# Patient Record
Sex: Male | Born: 1995 | Race: White | Hispanic: No | Marital: Single | Smoking: Never smoker
Health system: Southern US, Community
[De-identification: ages and names within clinical notes are randomized; demographics above are authoritative.]

## PROBLEM LIST (undated history)

## (undated) DIAGNOSIS — T7840XA Allergy, unspecified, initial encounter: Secondary | ICD-10-CM

## (undated) DIAGNOSIS — L709 Acne, unspecified: Secondary | ICD-10-CM

## (undated) HISTORY — DX: Allergy, unspecified, initial encounter: T78.40XA

## (undated) HISTORY — PX: WISDOM TOOTH EXTRACTION: SHX21

## (undated) HISTORY — DX: Acne, unspecified: L70.9

---
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
## 2015-02-08 ENCOUNTER — Ambulatory Visit (INDEPENDENT_AMBULATORY_CARE_PROVIDER_SITE_OTHER): Payer: Self-pay | Admitting: Family Medicine

## 2015-02-08 VITALS — BP 108/80 | HR 70 | Temp 98.2°F | Resp 16 | Ht 68.0 in | Wt 120.2 lb

## 2015-02-08 DIAGNOSIS — R300 Dysuria: Secondary | ICD-10-CM

## 2015-02-08 LAB — POCT URINALYSIS DIPSTICK
Bilirubin, UA: NEGATIVE
Blood, UA: NEGATIVE
GLUCOSE UA: NEGATIVE
Ketones, UA: NEGATIVE
LEUKOCYTES UA: NEGATIVE
NITRITE UA: NEGATIVE
PROTEIN UA: NEGATIVE
Spec Grav, UA: 1.015
UROBILINOGEN UA: 0.2
pH, UA: 7.5

## 2015-02-08 LAB — POCT UA - MICROSCOPIC ONLY
AMORPHOUS: POSITIVE
BACTERIA, U MICROSCOPIC: NEGATIVE
CRYSTALS, UR, HPF, POC: NEGATIVE
Casts, Ur, LPF, POC: NEGATIVE
EPITHELIAL CELLS, URINE PER MICROSCOPY: NEGATIVE
Mucus, UA: NEGATIVE
RBC, urine, microscopic: NEGATIVE
WBC, UR, HPF, POC: NEGATIVE
YEAST UA: NEGATIVE

## 2015-02-08 NOTE — Patient Instructions (Signed)
I will be in touch with the rest of your labs asap At this time I do not see any sign of a UTI or STI but we will talk once we get the rest of your labs Take care!

## 2015-02-08 NOTE — Progress Notes (Addendum)
[facility] [address], [place] [address] [phone]  Date:  02/08/2015   Name:  Jose Odom   DOB:  1995/08/06   MRN:  [id number]  PCP:  No PCP Per Patient    Chief Complaint: Other   History of Present Illness:  Jose Odom is a 19 y.o. very pleasant male patient who presents with the following:  Here today as a new patient with concern of burning with urination.  He feels a burning sensation in the very tip of his penis- no discomfort in the perineum or prostate area He has noted this about 3 weeks, has waxed and waned.   He has not noted any blood in his urine.   He has drinking a lot of water but his urine still does not seem to be clear No penile discharge or lesions He has never had a UTI in the past as far as he knows-  He has not had sex in about one year so he doubts STI but would like to check  There are no active problems to display for this patient.   Past Medical History  Diagnosis Date  . Allergy     Past Surgical History  Procedure Laterality Date  . Eye surgery      History  Substance Use Topics  . Smoking status: Never Smoker   . Smokeless tobacco: Not on file  . Alcohol Use: 1.8 - 3.0 oz/week    3-5 Standard drinks or equivalent per week    History reviewed. No pertinent family history.  Allergies  Allergen Reactions  . Amoxicillin Rash  . Penicillins Rash    Medication list has been reviewed and updated.  No current outpatient prescriptions on file prior to visit.   No current facility-administered medications on file prior to visit.    Review of Systems:  As per HPI- otherwise negative.   Physical Examination: Filed Vitals:   02/08/15 1348  BP: 108/80  Pulse: 70  Temp: 98.2 F (36.8 C)  Resp: 16   Filed Vitals:   02/08/15 1348  Height:  (1.727 m)  Weight: 120 lb 3.2 oz (54.522 kg)   Body mass index is 18.28 kg/(m^2). Ideal Body Weight: Weight in (lb) to have BMI = 25:  164.1  GEN: WDWN, NAD, Non-toxic, A & O x 3, looks well, slight build, looks well HEENT: Atraumatic, Normocephalic. Neck supple. No masses, No LAD. Ears and Nose: No external deformity. CV: RRR, No M/G/R. No JVD. No thrill. No extra heart sounds. PULM: CTA B, no wheezes, crackles, rhonchi. No retractions. No resp. distress. No accessory muscle use. EXTR: No c/c/e NEURO Normal gait.  PSYCH: Normally interactive. Conversant. Not depressed or anxious appearing.  Calm demeanor.  GU: no discharge or penile lesions, no testicular masses or tenderness   Assessment and Plan: Dysuria - Plan: POCT UA - Microscopic Only, POCT urinalysis dipstick, GC/Chlamydia Probe Amp, Urine culture  Dysuria today but no suggestion of UTI or STI.  He is ok with awaiting the rest of his labs- I will be in touch with him  Signed Abbe Amsterdam, MD pk to [facility] - cell phone  Received the rest of his labs 7/13- called and let him know.  He is feeling somewhat better.  Offered abx for possible parostitis but he prefers to see how he does over the next week. He will give [place] a call if his sx do not completely resolve.    Results for orders  placed or performed in visit on 02/08/15  GC/Chlamydia Probe Amp  Result Value Ref Range   CT Probe RNA NEGATIVE    GC Probe RNA NEGATIVE   Urine culture  Result Value Ref Range   Colony Count NO GROWTH    Organism ID, Bacteria NO GROWTH   POCT UA - Microscopic Only  Result Value Ref Range   WBC, Ur, HPF, POC negative    RBC, urine, microscopic negative    Bacteria, U Microscopic negative    Mucus, UA negative    Epithelial cells, urine per micros negative    Crystals, Ur, HPF, POC negative    Casts, Ur, LPF, POC negative    Yeast, UA negative    Amorphous positive   POCT urinalysis dipstick  Result Value Ref Range   Color, UA yellow    Clarity, UA clear    Glucose, UA negative    Bilirubin, UA negative    Ketones, UA negative    Spec Grav, UA 1.015    Blood, UA  negative    pH, UA 7.5    Protein, UA negative    Urobilinogen, UA 0.2    Nitrite, UA negative    Leukocytes, UA Negative Negative

## 2015-02-09 LAB — URINE CULTURE
Colony Count: NO GROWTH
Organism ID, Bacteria: NO GROWTH

## 2015-02-09 LAB — GC/CHLAMYDIA PROBE AMP
CT PROBE, AMP APTIMA: NEGATIVE
GC PROBE AMP APTIMA: NEGATIVE

## 2015-03-22 ENCOUNTER — Ambulatory Visit (INDEPENDENT_AMBULATORY_CARE_PROVIDER_SITE_OTHER): Payer: Self-pay

## 2015-03-22 ENCOUNTER — Ambulatory Visit (INDEPENDENT_AMBULATORY_CARE_PROVIDER_SITE_OTHER): Payer: Self-pay | Admitting: Family Medicine

## 2015-03-22 VITALS — BP 128/70 | HR 61 | Temp 98.3°F | Resp 18 | Ht 68.0 in | Wt 118.0 lb

## 2015-03-22 DIAGNOSIS — M79645 Pain in left finger(s): Secondary | ICD-10-CM

## 2015-03-22 DIAGNOSIS — M7989 Other specified soft tissue disorders: Secondary | ICD-10-CM

## 2015-03-22 NOTE — Progress Notes (Signed)
[facility] [address] [place] [address] [phone]  Date:  03/22/2015   Name:  Jose Odom   DOB:  May 14, 1996   MRN:  [id number]  PCP:  No PCP Per Patient    Chief Complaint: Hand Pain   History of Present Illness:  This is a 19 y.o. male who is presenting with left pinky finger pain x 1 week. States he noticed a bump on the palmar side of his proximal pinky finger 3 months ago. 1 week ago this bump became painful. He does not recall doing anything 3 months ago prior to bump appearing. He was moving a lot of furniture 1 week ago prior to pain starting. He has pain with touching the buming and with flexing his finger. He feels the finger is a little swollen. He denies other joint pain, fever, chills, paresthesias. No PMH.  Review of Systems:  Review of Systems See HPI  There are no active problems to display for this patient.   Prior to Admission medications   Not on File    Allergies  Allergen Reactions  . Neosporin [Neomycin-Bacitracin Zn-Polymyx] Rash  . Amoxicillin Rash  . Penicillins Rash    Past Surgical History  Procedure Laterality Date  . Eye surgery      Social History  Substance Use Topics  . Smoking status: Never Smoker   . Smokeless tobacco: None  . Alcohol Use: 1.8 - 3.0 oz/week    3-5 Standard drinks or equivalent per week    History reviewed. No pertinent family history.  Medication list has been reviewed and updated.  Physical Examination:  Physical Exam  Constitutional: He is oriented to person, place, and time. He appears well-developed and well-nourished. No distress.  HENT:  Head: Normocephalic and atraumatic.  Right Ear: Hearing normal.  Left Ear: Hearing normal.  Nose: Nose normal.  Eyes: Conjunctivae and lids are normal. Right eye exhibits no discharge. Left eye exhibits no discharge. No scleral icterus.  Cardiovascular: Normal rate, regular rhythm and normal pulses.   Pulmonary/Chest: Effort  normal. No respiratory distress.  Musculoskeletal: Normal range of motion.       Right hand: Normal. He exhibits normal range of motion and no deformity.       Left hand: He exhibits tenderness (ventral surface of left little finger, just distal to MCP. there is a tender bony nodule at site of tenderness), bony tenderness and swelling (mild swelling at proximal little finger). He exhibits normal range of motion and normal capillary refill. Normal sensation noted. Decreased strength noted. He exhibits finger abduction. He exhibits no thumb/finger opposition.  Neurological: He is alert and oriented to person, place, and time.  Skin: Skin is warm, dry and intact. No lesion and no rash noted.  Psychiatric: He has a normal mood and affect. His speech is normal and behavior is normal. Thought content normal.   BP 128/70 mmHg  Pulse 61  Temp(Src) 98.3 F (36.8 C) (Oral)  Resp 18  Ht 5\' 8"  (1.727 m)  Wt 118 lb (53.524 kg)  BMI 17.95 kg/m2  SpO2 99%  [facility] reading (PRIMARY) by  Dr. Patsy Lager: calcification present at base of left 5th digit   Assessment and Plan:  1. Finger pain, left 2. Calcification of soft tissue There is a calcification in the ventral soft tissue of the left 5th finger just distal to MCP. This calcification is palpated on exam and is tender. Referred to hand surgery for further eval/management. Counseled on  RICE in the meantime.  - DG Finger Little Left; Future - Ambulatory referral to Hand Surgery   Roswell Miners. Dyke Brackett, MHS [facility]  03/22/2015

## 2015-03-22 NOTE — Patient Instructions (Signed)
You will get a phone call to make appointment with hand specialist. Ibuprofen/tylenol, rest and ice in the meantime.

## 2015-11-08 ENCOUNTER — Ambulatory Visit (INDEPENDENT_AMBULATORY_CARE_PROVIDER_SITE_OTHER): Payer: Self-pay | Admitting: Physician Assistant

## 2015-11-08 VITALS — BP 118/70 | HR 70 | Temp 97.8°F | Resp 18 | Ht 68.0 in | Wt 121.2 lb

## 2015-11-08 DIAGNOSIS — Z23 Encounter for immunization: Secondary | ICD-10-CM

## 2015-11-08 NOTE — Patient Instructions (Signed)
     IF you received an x-ray today, you will receive an invoice from [facility]. Please contact [facility] at [phone] with questions or concerns regarding your invoice.   IF you received labwork today, you will receive an invoice from [facility]. Please contact [facility] at [phone] with questions or concerns regarding your invoice.   Our billing staff will not be able to assist you with questions regarding bills from these companies.  You will be contacted with the lab results as soon as they are available. The fastest way to get your results is to activate your My Chart account. Instructions are located on the last page of this paperwork. If you have not heard from us regarding the results in 2 weeks, please contact this office.      

## 2015-11-08 NOTE — Progress Notes (Signed)
[facility] [address], [place] [id number] [phone]  Date:  11/08/2015   Name:  Jose Odom   DOB:  04/29/1996   MRN:  [id number]  PCP:  No PCP Per Patient    Chief Complaint: Immunizations   History of Present Illness:  This is a 20 y.o. male who is presenting need tdap for school. Denies fever or chills.  Review of Systems:  Review of Systems See HPI  There are no active problems to display for this patient.   Prior to Admission medications   Not on File    Allergies  Allergen Reactions  . Neosporin [Neomycin-Bacitracin Zn-Polymyx] Rash  . Amoxicillin Rash  . Penicillins Rash    Past Surgical History  Procedure Laterality Date  . Eye surgery      Social History  Substance Use Topics  . Smoking status: Never Smoker   . Smokeless tobacco: None  . Alcohol Use: 1.8 - 3.0 oz/week    3-5 Standard drinks or equivalent per week    History reviewed. No pertinent family history.  Medication list has been reviewed and updated.  Physical Examination:  Physical Exam  Constitutional: He is oriented to person, place, and time. He appears well-developed and well-nourished. No distress.  HENT:  Head: Normocephalic and atraumatic.  Right Ear: Hearing normal.  Left Ear: Hearing normal.  Nose: Nose normal.  Eyes: Conjunctivae and lids are normal. Right eye exhibits no discharge. Left eye exhibits no discharge. No scleral icterus.  Pulmonary/Chest: Effort normal. No respiratory distress.  Musculoskeletal: Normal range of motion.  Neurological: He is alert and oriented to person, place, and time.  Skin: Skin is warm, dry and intact. No lesion and no rash noted.  Psychiatric: He has a normal mood and affect. His speech is normal and behavior is normal. Thought content normal.   BP 118/70 mmHg  Pulse 70  Temp(Src) 97.8 F (36.6 C) (Oral)  Resp 18  Ht 5\' 8"  (1.727 m)  Wt 121 lb 3.2 oz (54.976 kg)  BMI 18.43 kg/m2  SpO2  98%  Assessment and Plan:  1. Need for Tdap vaccination - Tdap vaccine greater than or equal to 7yo IM   Roswell MinersNicole V. Dyke BrackettBush, PA-C, MHS [facility]  11/08/2015

## 2016-02-11 IMAGING — CR DG FINGER LITTLE 2+V*L*
1 series · 1 of 1 positions shown · non-contrast
Comparison: None in PACs

CLINICAL DATA: Palpable knot just distal to the metacarpophalangeal
joint of the fifth finger with tenderness for the past week

EXAM:
LEFT LITTLE FINGER 2+V

[PA]
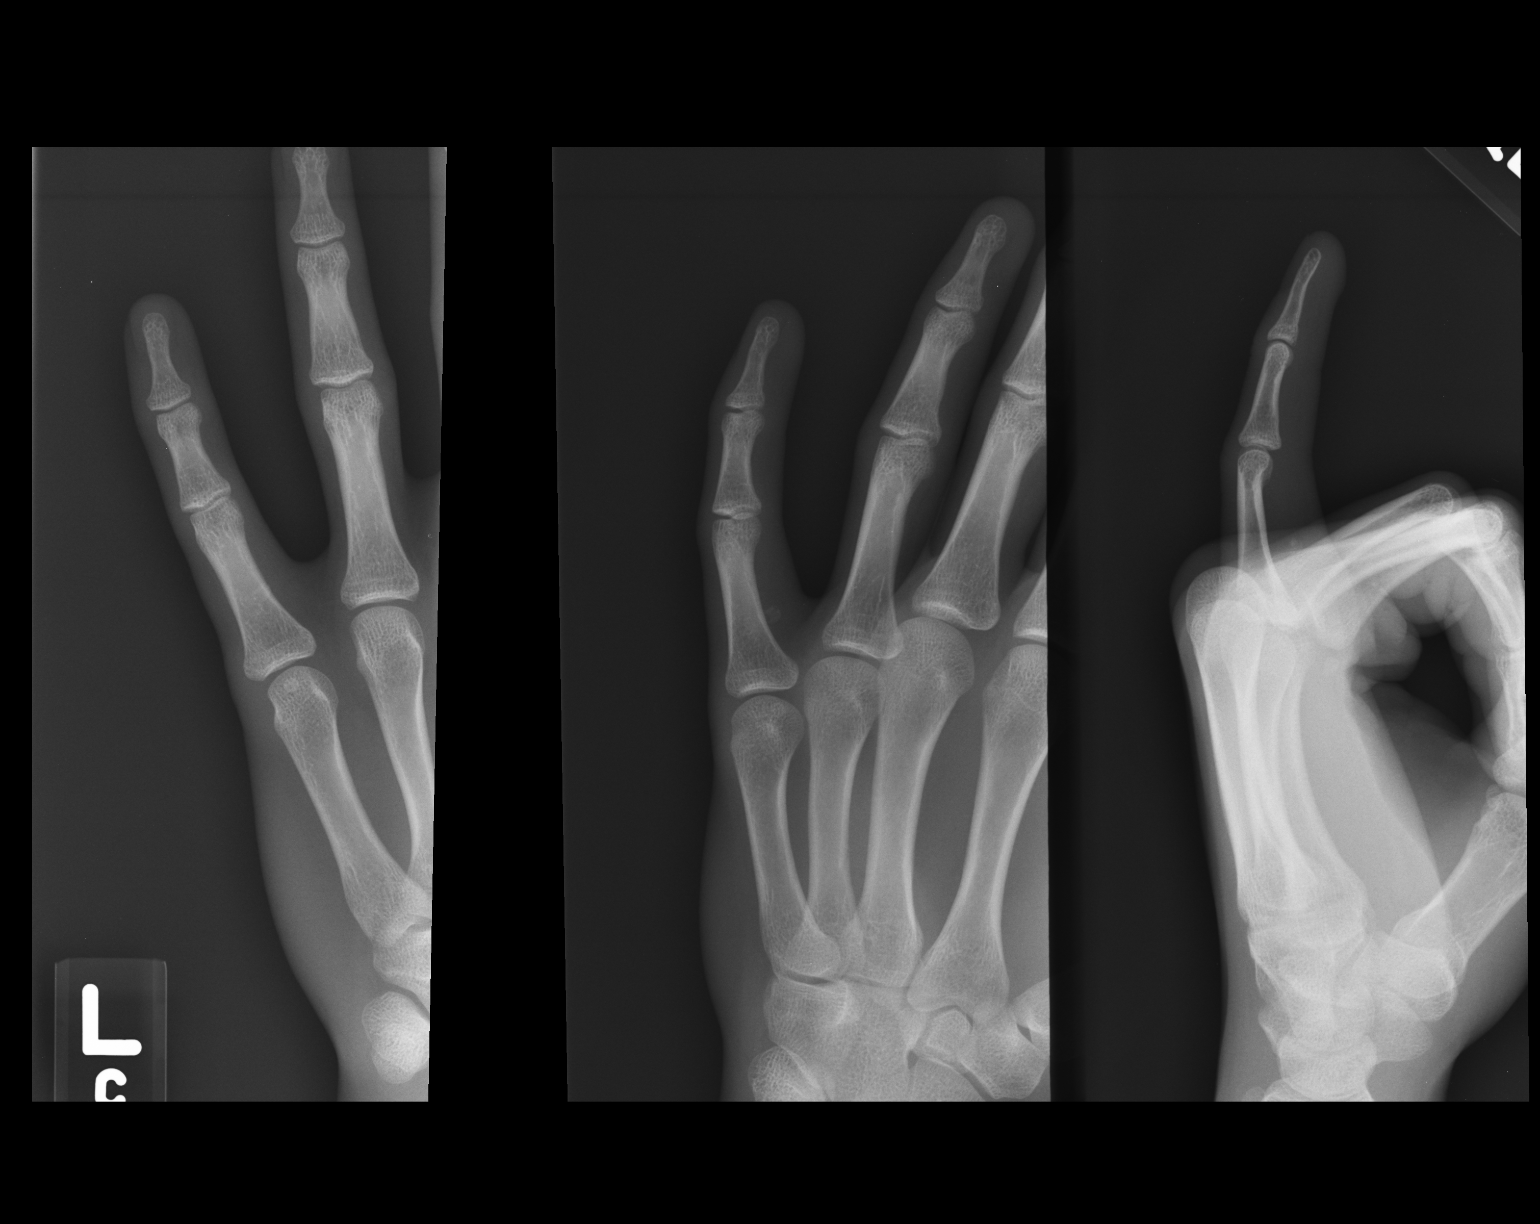

[1 of 1 positions shown; findings below may reference images not displayed]

FINDINGS: The bones are adequately mineralized. The joint spaces are
preserved. Adjacent to the radial aspect of the junction of the
proximal and middle thirds of the proximal phalanx of the fifth
finger there is a 3 mm diameter calcification. This appears to lie
in the ventral soft tissues; it is not clearly attack arising from
the underlying bone.
IMPRESSION: Soft tissue radiodensity likely reflecting calcium within the
ventral soft tissues overlying the proximal phalanx of the fifth
finger without definite detachment the underlying bone. This may
reflect a foreign body if there has been penetrating injury. Soft
tissue calcification in response to previous trauma or infection is
not excluded. Correlation with the radiographic finding with the
site of the palpable finding is needed. Further evaluation with MRI
could be considered if felt clinically indicated. Alternatively
follow-up radiographs considered if the patient's symptoms persist.

## 2016-09-20 ENCOUNTER — Ambulatory Visit: Payer: Self-pay

## 2017-09-19 ENCOUNTER — Encounter: Payer: Self-pay | Admitting: Adult Health

## 2017-09-19 ENCOUNTER — Ambulatory Visit (INDEPENDENT_AMBULATORY_CARE_PROVIDER_SITE_OTHER): Admitting: Adult Health

## 2017-09-19 VITALS — BP 104/64 | Temp 98.2°F | Wt 119.0 lb

## 2017-09-19 DIAGNOSIS — L709 Acne, unspecified: Secondary | ICD-10-CM

## 2017-09-19 DIAGNOSIS — Z7689 Persons encountering health services in other specified circumstances: Secondary | ICD-10-CM | POA: Diagnosis not present

## 2017-09-19 NOTE — Progress Notes (Signed)
Patient presents to clinic today to establish care. He is a pleasant 22 year old male who  has a past medical history of Acne and Allergy.   Acute Concerns: Establish Care   Chronic Issues: Acne - is prescribed doxycycline 100 mg daily   Health Maintenance: Dental --Routine Care  Vision -- Routine Care  Immunizations -- UTD     Past Medical History:  Diagnosis Date  . Acne   . Allergy     Past Surgical History:  Procedure Laterality Date  . EYE SURGERY  1999  . WISDOM TOOTH EXTRACTION      Current Outpatient Medications on File Prior to Visit  Medication Sig Dispense Refill  . doxycycline (VIBRAMYCIN) 100 MG capsule Take 1 capsule by mouth daily. For Acne  0   No current facility-administered medications on file prior to visit.     Allergies  Allergen Reactions  . Neosporin [Neomycin-Bacitracin Zn-Polymyx] Rash  . Amoxicillin Rash  . Penicillins Rash    Family History  Problem Relation Age of Onset  . Alcohol abuse Father   . Heart attack Father   . Heart disease Father   . Hypertension Father   . Melanoma Mother   . Diabetes Mother   . Hearing loss Mother   . Hyperlipidemia Mother   . Diabetes Maternal Grandmother   . Hyperlipidemia Maternal Grandmother   . Stroke Maternal Grandmother   . Diabetes Maternal Grandfather   . Heart attack Maternal Grandfather   . Heart disease Maternal Grandfather   . Hyperlipidemia Maternal Grandfather   . Hypertension Maternal Grandfather   . Cancer Paternal Grandmother   . Heart attack Paternal Grandfather   . Hypertension Paternal Grandfather     Social History   Socioeconomic History  . Marital status: Single    Spouse name: Not on file  . Number of children: Not on file  . Years of education: Not on file  . Highest education level: Not on file  Social Needs  . Financial resource strain: Not on file  . Food insecurity - worry: Not on file  . Food insecurity - inability: Not on file  . Transportation  needs - medical: Not on file  . Transportation needs - non-medical: Not on file  Occupational History  . Not on file  Tobacco Use  . Smoking status: Never Smoker  . Smokeless tobacco: Never Used  Substance and Sexual Activity  . Alcohol use: Yes    Alcohol/week: 1.8 - 3.0 oz    Types: 3 - 5 Standard drinks or equivalent per week  . Drug use: No  . Sexual activity: No  Other Topics Concern  . Not on file  Social History Narrative   He is at [employer] for geology and environmental science    Works part time in lawn care       He enjoys video games and reading    Review of Systems  Constitutional: Negative.   Eyes: Negative.   Respiratory: Negative.   Cardiovascular: Negative.   Gastrointestinal: Negative.   Genitourinary: Negative.   Musculoskeletal: Negative.   Skin: Negative.   Neurological: Negative.   Endo/Heme/Allergies: Negative.   Psychiatric/Behavioral: Negative.   All other systems reviewed and are negative.   BP 104/64 (BP Location: Left Arm)   Temp 98.2 F (36.8 C) (Oral)   Wt 119 lb (54 kg)   BMI 18.09 kg/m   Physical Exam  Constitutional: He is oriented to person, place, and time and well-developed,  well-nourished, and in no distress. No distress.  Cardiovascular: Normal rate, regular rhythm, normal heart sounds and intact distal pulses. Exam reveals no gallop and no friction rub.  No murmur heard. Pulmonary/Chest: Effort normal and breath sounds normal. No respiratory distress. He has no wheezes. He has no rales. He exhibits no tenderness.  Neurological: He is alert and oriented to person, place, and time. He has normal reflexes. He displays normal reflexes. No cranial nerve deficit. He exhibits normal muscle tone. Gait normal. Coordination normal. GCS score is 15.  Skin: Skin is warm and dry. No rash noted. He is not diaphoretic. No erythema. No pallor.  Psychiatric: Mood, memory, affect and judgment normal.  Nursing note and vitals reviewed.      Assessment/Plan: 1. Encounter to establish care - He will follow up in 2 weeks for is CPE  - Follow up sooner if needed  2. Acne, unspecified acne type - Follow up with Dermatology as directed   Shirline Freesory Zeth Buday, NP

## 2017-09-19 NOTE — Patient Instructions (Signed)
It was great meeting you today   I will see you in a couple of weeks for your physical   Please let me know if you need anything

## 2017-10-03 ENCOUNTER — Telehealth: Payer: Self-pay | Admitting: Adult Health

## 2017-10-03 NOTE — Telephone Encounter (Signed)
Cory will be out of the office 10/04/17-10/05/17 ° °LMVM for the patient to call back and reschedule ° °Cory agreed to have the patients CPE appointments scheduled back to back  ° °

## 2017-10-04 ENCOUNTER — Encounter: Payer: Self-pay | Admitting: Adult Health

## 2017-10-10 ENCOUNTER — Ambulatory Visit (INDEPENDENT_AMBULATORY_CARE_PROVIDER_SITE_OTHER): Admitting: Adult Health

## 2017-10-10 ENCOUNTER — Encounter: Payer: Self-pay | Admitting: Adult Health

## 2017-10-10 VITALS — BP 98/66 | Temp 97.7°F | Ht 67.0 in | Wt 117.0 lb

## 2017-10-10 DIAGNOSIS — Z0000 Encounter for general adult medical examination without abnormal findings: Secondary | ICD-10-CM

## 2017-10-10 DIAGNOSIS — Z1159 Encounter for screening for other viral diseases: Secondary | ICD-10-CM | POA: Diagnosis not present

## 2017-10-10 DIAGNOSIS — Z114 Encounter for screening for human immunodeficiency virus [HIV]: Secondary | ICD-10-CM

## 2017-10-10 LAB — HEPATIC FUNCTION PANEL
ALBUMIN: 4.8 g/dL (ref 3.5–5.2)
ALK PHOS: 73 U/L (ref 39–117)
ALT: 57 U/L — ABNORMAL HIGH (ref 0–53)
AST: 68 U/L — ABNORMAL HIGH (ref 0–37)
BILIRUBIN DIRECT: 0.3 mg/dL (ref 0.0–0.3)
Total Bilirubin: 2.2 mg/dL — ABNORMAL HIGH (ref 0.2–1.2)
Total Protein: 7.1 g/dL (ref 6.0–8.3)

## 2017-10-10 LAB — CBC WITH DIFFERENTIAL/PLATELET
BASOS ABS: 0 10*3/uL (ref 0.0–0.1)
Basophils Relative: 0.5 % (ref 0.0–3.0)
Eosinophils Absolute: 0.1 10*3/uL (ref 0.0–0.7)
Eosinophils Relative: 1 % (ref 0.0–5.0)
HCT: 50.4 % (ref 39.0–52.0)
Hemoglobin: 17.5 g/dL — ABNORMAL HIGH (ref 13.0–17.0)
LYMPHS ABS: 2.6 10*3/uL (ref 0.7–4.0)
Lymphocytes Relative: 45.1 % (ref 12.0–46.0)
MCHC: 34.7 g/dL (ref 30.0–36.0)
MCV: 87.5 fl (ref 78.0–100.0)
Monocytes Absolute: 0.5 10*3/uL (ref 0.1–1.0)
Monocytes Relative: 8.5 % (ref 3.0–12.0)
NEUTROS ABS: 2.6 10*3/uL (ref 1.4–7.7)
NEUTROS PCT: 44.9 % (ref 43.0–77.0)
PLATELETS: 396 10*3/uL (ref 150.0–400.0)
RBC: 5.75 Mil/uL (ref 4.22–5.81)
RDW: 12.9 % (ref 11.5–15.5)
WBC: 5.8 10*3/uL (ref 4.0–10.5)

## 2017-10-10 LAB — BASIC METABOLIC PANEL
BUN: 14 mg/dL (ref 6–23)
CALCIUM: 10.7 mg/dL — AB (ref 8.4–10.5)
CO2: 32 meq/L (ref 19–32)
Chloride: 100 mEq/L (ref 96–112)
Creatinine, Ser: 0.84 mg/dL (ref 0.40–1.50)
GFR: 119.39 mL/min (ref >60.00)
Glucose, Bld: 87 mg/dL (ref 70–99)
Potassium: 4.7 mEq/L (ref 3.5–5.1)
Sodium: 137 mEq/L (ref 135–145)

## 2017-10-10 LAB — LIPID PANEL
CHOL/HDL RATIO: 4
Cholesterol: 171 mg/dL (ref 0–200)
HDL: 45.2 mg/dL (ref >39.00)
LDL Cholesterol: 101 mg/dL — ABNORMAL HIGH (ref 0–99)
NONHDL: 125.3
Triglycerides: 120 mg/dL (ref 0.0–149.0)
VLDL: 24 mg/dL (ref 0.0–40.0)

## 2017-10-10 LAB — TSH: TSH: 1.9 u[IU]/mL (ref 0.35–4.50)

## 2017-10-10 NOTE — Progress Notes (Signed)
Subjective:    Patient ID: Jose Odom, male    DOB: 04/07/1996, 22 y.o.   MRN: [id number]  HPI Patient presents for yearly preventative medicine examination. 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 He is not currently in a relationship.  He currently lives at home with his mother in a separate apartment.  He routinely sees his dentist and dermatology.  He has noticed a change in his vision and is seeing the optometrist in April.   All immunizations and health maintenance protocols were reviewed with the patient and needed orders were placed.He is not due Tdap until 2027.   Appropriate screening laboratory values were ordered for the patient including screening of hyperlipidemia, renal function and hepatic function. He would like to have HIV and Hep C screening.   Medication reconciliation,  past medical history, social history, problem list and allergies were reviewed in detail with the patient.  No medication refills needed at this time.   Goals were established with regard to weight loss, exercise, and  diet in compliance with medications.  He likes to play video games. 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  He does smoke or use illicit drugs.  He does on the rare occasion drink alcohol but never drives.  He wears his seatbelt at all times in the car.  He is not exposed to gun violence.  He does not participate in a fraternity or irrational behavior. His mother cooks most of his meals  And they are balanced but he does eat on the go with fast food.  He denies any depression, SI or HI.   Review of Systems  Constitutional: Negative.   Respiratory: Negative.   Cardiovascular: Negative.   Gastrointestinal: Negative.   Genitourinary: Negative.   Musculoskeletal: Negative.   Neurological: Negative.   Psychiatric/Behavioral: Negative.       Past Medical History:  Diagnosis Date  . Acne   . Allergy     Social History    Socioeconomic History  . Marital status: Single    Spouse name: Not on file  . Number of children: Not on file  . Years of education: Not on file  . Highest education level: Not on file  Social Needs  . Financial resource strain: Not on file  . Food insecurity - worry: Not on file  . Food insecurity - inability: Not on file  . Transportation needs - medical: Not on file  . Transportation needs - non-medical: Not on file  Occupational History  . Not on file  Tobacco Use  . Smoking status: Never Smoker  . Smokeless tobacco: Never Used  Substance and Sexual Activity  . Alcohol use: Yes    Alcohol/week: 1.8 - 3.0 oz    Types: 3 - 5 Standard drinks or equivalent per week  . Drug use: No  . Sexual activity: No  Other Topics Concern  . Not on file  Social History Narrative   He is at [employer] for geology and environmental science    Works part time in lawn care       He enjoys video games and reading    Past Surgical History:  Procedure Laterality Date  . EYE SURGERY  1999  . WISDOM TOOTH EXTRACTION      Family History  Problem Relation Age of Onset  . Alcohol abuse Father   . Heart attack Father   . Heart disease Father   . Hypertension  Father   . Melanoma Mother   . Diabetes Mother   . Hearing loss Mother   . Hyperlipidemia Mother   . Diabetes Maternal Grandmother   . Hyperlipidemia Maternal Grandmother   . Stroke Maternal Grandmother   . Diabetes Maternal Grandfather   . Heart attack Maternal Grandfather   . Heart disease Maternal Grandfather   . Hyperlipidemia Maternal Grandfather   . Hypertension Maternal Grandfather   . Cancer Paternal Grandmother   . Heart attack Paternal Grandfather   . Hypertension Paternal Grandfather     Allergies  Allergen Reactions  . Neosporin [Neomycin-Bacitracin Zn-Polymyx] Rash  . Amoxicillin Rash  . Penicillins Rash    Current Outpatient Medications on File Prior to Visit  Medication Sig Dispense Refill  .  doxycycline (VIBRAMYCIN) 100 MG capsule Take 1 capsule by mouth daily. For Acne  0   No current facility-administered medications on file prior to visit.     BP 98/66   Temp 97.7 F (36.5 C) (Oral)   Ht 5\' 7"  (1.702 m)   Wt 117 lb (53.1 kg)   BMI 18.32 kg/m   Objective:   Physical Exam  Constitutional: He is oriented to person, place, and time. He appears well-developed and well-nourished. No distress.  HENT:  Head: Normocephalic and atraumatic.  Right Ear: External ear normal.  Left Ear: External ear normal.  Nose: Nose normal.  Mouth/Throat: Oropharynx is clear and moist.  Eyes: Conjunctivae are normal. Pupils are equal, round, and reactive to light.  Neck: Neck supple. No thyromegaly present.  Cardiovascular: Normal rate, regular rhythm, normal heart sounds and intact distal pulses. Exam reveals no gallop and no friction rub.  No murmur heard. Pulmonary/Chest: Effort normal and breath sounds normal. No respiratory distress. He has no wheezes. He has no rales.  Abdominal: Soft. Bowel sounds are normal. He exhibits no distension and no mass. There is no tenderness. There is no rebound and no guarding.  Musculoskeletal: Normal range of motion. He exhibits no edema, tenderness or deformity.  Upper and lower extremity strength is 5/5  Lymphadenopathy:    He has no cervical adenopathy.  Neurological: He is alert and oriented to person, place, and time.  Skin: Skin is warm and dry.  Psychiatric: He has a normal mood and affect. His behavior is normal. Judgment and thought content normal.  Nursing note and vitals reviewed.     Assessment & Plan:  1. Routine general medical examination at a health care facility Will check labs and notify results.  Continue exercise and healthy diet.   - Basic metabolic panel - CBC with Differential/Platelet - Hepatic function panel - Lipid panel - TSH  2. Encounter for screening for HIV Check labs and notify of results.  - HIV  antibody  3. Need for hepatitis C screening test Check labs and notify of results.  - Hep C Antibody  Follow up as needed Manjinder Breau C Kalie Cabral BSN RN NP student

## 2017-10-10 NOTE — Progress Notes (Signed)
Subjective:    Patient ID: Jose Odom, male    DOB: 05/08/1996, 22 y.o.   MRN: [id number]  HPI Patient presents for yearly preventative medicine examination. He is a pleasant 22 year old male who  has a past medical history of Acne and Allergy.  He is seen by Dermatology and is prescribed Doxycycline for Acne   All immunizations and health maintenance protocols were reviewed with the patient and needed orders were placed. UTD   Appropriate screening laboratory values were ordered for the patient including screening of hyperlipidemia, renal function and hepatic function.  Medication reconciliation,  past medical history, social history, problem list and allergies were reviewed in detail with the patient  Goals were established with regard to weight loss, exercise, and  diet in compliance with medications  He does routine dental and vision care   He would like to have a Hep C screen due to having a tattoo.    Review of Systems  Constitutional: Negative.   HENT: Negative.   Eyes: Negative.   Respiratory: Negative.   Cardiovascular: Negative.   Gastrointestinal: Negative.   Endocrine: Negative.   Genitourinary: Negative.   Musculoskeletal: Negative.   Skin: Negative.   Allergic/Immunologic: Negative.   Neurological: Negative.   Hematological: Negative.   Psychiatric/Behavioral: Negative.   All other systems reviewed and are negative.  Past Medical History:  Diagnosis Date  . Acne   . Allergy     Social History   Socioeconomic History  . Marital status: Single    Spouse name: Not on file  . Number of children: Not on file  . Years of education: Not on file  . Highest education level: Not on file  Social Needs  . Financial resource strain: Not on file  . Food insecurity - worry: Not on file  . Food insecurity - inability: Not on file  . Transportation needs - medical: Not on file  . Transportation needs - non-medical: Not on file  Occupational History  .  Not on file  Tobacco Use  . Smoking status: Never Smoker  . Smokeless tobacco: Never Used  Substance and Sexual Activity  . Alcohol use: Yes    Alcohol/week: 1.8 - 3.0 oz    Types: 3 - 5 Standard drinks or equivalent per week  . Drug use: No  . Sexual activity: No  Other Topics Concern  . Not on file  Social History Narrative   He is at [employer] for geology and environmental science    Works part time in lawn care       He enjoys video games and reading    Past Surgical History:  Procedure Laterality Date  . EYE SURGERY  1999  . WISDOM TOOTH EXTRACTION      Family History  Problem Relation Age of Onset  . Alcohol abuse Father   . Heart attack Father   . Heart disease Father   . Hypertension Father   . Melanoma Mother   . Diabetes Mother   . Hearing loss Mother   . Hyperlipidemia Mother   . Diabetes Maternal Grandmother   . Hyperlipidemia Maternal Grandmother   . Stroke Maternal Grandmother   . Diabetes Maternal Grandfather   . Heart attack Maternal Grandfather   . Heart disease Maternal Grandfather   . Hyperlipidemia Maternal Grandfather   . Hypertension Maternal Grandfather   . Cancer Paternal Grandmother   . Heart attack Paternal Grandfather   . Hypertension Paternal Grandfather  Allergies  Allergen Reactions  . Neosporin [Neomycin-Bacitracin Zn-Polymyx] Rash  . Amoxicillin Rash  . Penicillins Rash    Current Outpatient Medications on File Prior to Visit  Medication Sig Dispense Refill  . doxycycline (VIBRAMYCIN) 100 MG capsule Take 1 capsule by mouth daily. For Acne  0   No current facility-administered medications on file prior to visit.     BP 98/66   Temp 97.7 F (36.5 C) (Oral)   Ht 5\' 7"  (1.702 m)   Wt 117 lb (53.1 kg)   BMI 18.32 kg/m       Objective:   Physical Exam  Constitutional: He is oriented to person, place, and time. He appears well-developed and well-nourished. No distress.  HENT:  Head: Normocephalic and  atraumatic.  Right Ear: External ear normal.  Left Ear: External ear normal.  Nose: Nose normal.  Mouth/Throat: Oropharynx is clear and moist. No oropharyngeal exudate.  Eyes: Conjunctivae and EOM are normal. Pupils are equal, round, and reactive to light. Right eye exhibits no discharge. Left eye exhibits no discharge. No scleral icterus.  Neck: Normal range of motion. Neck supple. No thyromegaly present.  Cardiovascular: Normal rate, regular rhythm, normal heart sounds and intact distal pulses. Exam reveals no gallop and no friction rub.  No murmur heard. Pulmonary/Chest: Effort normal and breath sounds normal. No respiratory distress. He has no wheezes. He has no rales. He exhibits no tenderness.  Abdominal: Soft. Bowel sounds are normal. He exhibits no distension and no mass. There is no tenderness. There is no rebound and no guarding.  Lymphadenopathy:    He has no cervical adenopathy.  Neurological: He is alert and oriented to person, place, and time.  Skin: Skin is warm and dry. No rash noted. He is not diaphoretic. No erythema. No pallor.  Psychiatric: He has a normal mood and affect. His behavior is normal. Judgment and thought content normal.  Nursing note and vitals reviewed.     Assessment & Plan:  1. Routine general medical examination at a health care facility - Benign exam - Continue to exercise and eat healthy  - Follow up in 1-2 years  - Basic metabolic panel - CBC with Differential/Platelet - Hepatic function panel - Lipid panel - TSH  2. Encounter for screening for HIV  - HIV antibody  3. Need for hepatitis C screening test  - Hep C Antibody   Shirline Frees, NP

## 2017-10-11 LAB — HEPATITIS C ANTIBODY
Hepatitis C Ab: NONREACTIVE
SIGNAL TO CUT-OFF: 0.01 (ref <1.00)

## 2017-10-11 LAB — HIV ANTIBODY (ROUTINE TESTING W REFLEX): HIV: NONREACTIVE

## 2019-03-05 ENCOUNTER — Encounter: Payer: Self-pay | Admitting: Adult Health

## 2019-03-05 ENCOUNTER — Other Ambulatory Visit: Payer: Self-pay

## 2019-03-05 ENCOUNTER — Ambulatory Visit (INDEPENDENT_AMBULATORY_CARE_PROVIDER_SITE_OTHER): Admitting: Adult Health

## 2019-03-05 VITALS — BP 102/62 | Temp 97.5°F | Ht 68.0 in | Wt 125.0 lb

## 2019-03-05 DIAGNOSIS — R748 Abnormal levels of other serum enzymes: Secondary | ICD-10-CM

## 2019-03-05 DIAGNOSIS — Z0000 Encounter for general adult medical examination without abnormal findings: Secondary | ICD-10-CM

## 2019-03-05 LAB — COMPREHENSIVE METABOLIC PANEL
ALT: 15 U/L (ref 0–53)
AST: 17 U/L (ref 0–37)
Albumin: 4.6 g/dL (ref 3.5–5.2)
Alkaline Phosphatase: 62 U/L (ref 39–117)
BUN: 12 mg/dL (ref 6–23)
CO2: 31 mEq/L (ref 19–32)
Calcium: 10 mg/dL (ref 8.4–10.5)
Chloride: 103 mEq/L (ref 96–112)
Creatinine, Ser: 0.87 mg/dL (ref 0.40–1.50)
GFR: 106.63 mL/min (ref >60.00)
Glucose, Bld: 88 mg/dL (ref 70–99)
Potassium: 4.2 mEq/L (ref 3.5–5.1)
Sodium: 140 mEq/L (ref 135–145)
Total Bilirubin: 0.8 mg/dL (ref 0.2–1.2)
Total Protein: 6.8 g/dL (ref 6.0–8.3)

## 2019-03-05 LAB — CBC WITH DIFFERENTIAL/PLATELET
Basophils Absolute: 0 10*3/uL (ref 0.0–0.1)
Basophils Relative: 0.4 % (ref 0.0–3.0)
Eosinophils Absolute: 0.2 10*3/uL (ref 0.0–0.7)
Eosinophils Relative: 2.6 % (ref 0.0–5.0)
HCT: 47.8 % (ref 39.0–52.0)
Hemoglobin: 16.2 g/dL (ref 13.0–17.0)
Lymphocytes Relative: 48.2 % — ABNORMAL HIGH (ref 12.0–46.0)
Lymphs Abs: 3 10*3/uL (ref 0.7–4.0)
MCHC: 33.9 g/dL (ref 30.0–36.0)
MCV: 89.7 fl (ref 78.0–100.0)
Monocytes Absolute: 0.5 10*3/uL (ref 0.1–1.0)
Monocytes Relative: 8.9 % (ref 3.0–12.0)
Neutro Abs: 2.5 10*3/uL (ref 1.4–7.7)
Neutrophils Relative %: 39.9 % — ABNORMAL LOW (ref 43.0–77.0)
Platelets: 342 10*3/uL (ref 150.0–400.0)
RBC: 5.33 Mil/uL (ref 4.22–5.81)
RDW: 12.6 % (ref 11.5–15.5)
WBC: 6.2 10*3/uL (ref 4.0–10.5)

## 2019-03-05 LAB — LIPID PANEL
Cholesterol: 166 mg/dL (ref 0–200)
HDL: 44.8 mg/dL (ref >39.00)
LDL Cholesterol: 98 mg/dL (ref 0–99)
NonHDL: 121.52
Total CHOL/HDL Ratio: 4
Triglycerides: 119 mg/dL (ref 0.0–149.0)
VLDL: 23.8 mg/dL (ref 0.0–40.0)

## 2019-03-05 LAB — TSH: TSH: 2.82 u[IU]/mL (ref 0.35–4.50)

## 2019-03-05 NOTE — Progress Notes (Signed)
Subjective:    Patient ID: Jose Odom, male    DOB: 07/16/1996, 23 y.o.   MRN: [id number]  HPI  Patient presents for yearly preventative medicine examination. He is a pleasant 23 year old male who  has a past medical history of Acne and Allergy.   Elevated Liver Enzymes- last year had slightly elevated AST/ALT. He has been working on diet and has been exercising more over the last year.  Lab Results  Component Value Date   ALT 57 (H) 10/10/2017   AST 68 (H) 10/10/2017   ALKPHOS 73 10/10/2017   BILITOT 2.2 (H) 10/10/2017    All immunizations and health maintenance protocols were reviewed with the patient and needed orders were placed.  Appropriate screening laboratory values were ordered for the patient including screening of hyperlipidemia, renal function and hepatic function.  Medication reconciliation,  past medical history, social history, problem list and allergies were reviewed in detail with the patient  Goals were established with regard to weight loss, exercise, and  diet in compliance with medications.  He eats healthy stays active   Wt Readings from Last 3 Encounters:  03/05/19 125 lb (56.7 kg)  10/10/17 117 lb (53.1 kg)  09/19/17 119 lb (54 kg)    He does not smoke or use illicit drugs.  Very rarely drinks any alcohol.  He denies depression, SI or HI.  He is up-to-date on routine dental and vision screens   Review of Systems  Constitutional: Negative.   HENT: Negative.   Eyes: Negative.   Respiratory: Negative.   Cardiovascular: Negative.   Gastrointestinal: Negative.   Endocrine: Negative.   Genitourinary: Negative.   Musculoskeletal: Negative.   Skin: Negative.   Allergic/Immunologic: Negative.   Neurological: Negative.   Hematological: Negative.   Psychiatric/Behavioral: Negative.   All other systems reviewed and are negative.  Past Medical History:  Diagnosis Date  . Acne   . Allergy     Social History   Socioeconomic History  .  Marital status: Single    Spouse name: Not on file  . Number of children: Not on file  . Years of education: Not on file  . Highest education level: Not on file  Occupational History  . Not on file  Social Needs  . Financial resource strain: Not on file  . Food insecurity    Worry: Not on file    Inability: Not on file  . Transportation needs    Medical: Not on file    Non-medical: Not on file  Tobacco Use  . Smoking status: Never Smoker  . Smokeless tobacco: Never Used  Substance and Sexual Activity  . Alcohol use: Yes    Alcohol/week: 3.0 - 5.0 standard drinks    Types: 3 - 5 Standard drinks or equivalent per week  . Drug use: No  . Sexual activity: Never  Lifestyle  . Physical activity    Days per week: Not on file    Minutes per session: Not on file  . Stress: Not on file  Relationships  . Social Musicianconnections    Talks on phone: Not on file    Gets together: Not on file    Attends religious service: Not on file    Active member of club or organization: Not on file    Attends meetings of clubs or organizations: Not on file    Relationship status: Not on file  . Intimate partner violence    Fear of current or ex partner: Not  on file    Emotionally abused: Not on file    Physically abused: Not on file    Forced sexual activity: Not on file  Other Topics Concern  . Not on file  Social History Narrative   He is at [employer] for [employer]ology and environmental science    Works part time in lawn care       He enjoys video games and reading    Past Surgical History:  Procedure Laterality Date  . EYE SURGERY  1999  . WISDOM TOOTH EXTRACTION      Family History  Problem Relation Age of Onset  . Alcohol abuse Father   . Heart attack Father   . Heart disease Father   . Hypertension Father   . Melanoma Mother   . Diabetes Mother   . Hearing loss Mother   . Hyperlipidemia Mother   . Diabetes Maternal Grandmother   . Hyperlipidemia Maternal Grandmother   .  Stroke Maternal Grandmother   . Diabetes Maternal Grandfather   . Heart attack Maternal Grandfather   . Heart disease Maternal Grandfather   . Hyperlipidemia Maternal Grandfather   . Hypertension Maternal Grandfather   . Cancer Paternal Grandmother   . Heart attack Paternal Grandfather   . Hypertension Paternal Grandfather     Allergies  Allergen Reactions  . Neosporin [Neomycin-Bacitracin Zn-Polymyx] Rash  . Amoxicillin Rash  . Penicillins Rash    Current Outpatient Medications on File Prior to Visit  Medication Sig Dispense Refill  . doxycycline (VIBRAMYCIN) 100 MG capsule Take 1 capsule by mouth daily. For Acne  0   No current facility-administered medications on file prior to visit.     There were no vitals taken for this visit.      Objective:   Physical Exam Vitals signs and nursing note reviewed.  Constitutional:      General: He is not in acute distress.    Appearance: Normal appearance. He is not diaphoretic.  HENT:     Head: Normocephalic and atraumatic.     Right Ear: Tympanic membrane, ear canal and external ear normal. There is no impacted cerumen.     Left Ear: Tympanic membrane, ear canal and external ear normal. There is no impacted cerumen.     Nose: Nose normal. No congestion or rhinorrhea.     Mouth/Throat:     Mouth: Mucous membranes are moist.     Pharynx: Oropharynx is clear. No oropharyngeal exudate or posterior oropharyngeal erythema.  Eyes:     General: No scleral icterus.       Right eye: No discharge.        Left eye: No discharge.     Extraocular Movements: Extraocular movements intact.     Conjunctiva/sclera: Conjunctivae normal.     Pupils: Pupils are equal, round, and reactive to light.  Neck:     Musculoskeletal: Normal range of motion and neck supple.     Thyroid: No thyromegaly.     Vascular: No JVD.     Trachea: No tracheal deviation.  Cardiovascular:     Rate and Rhythm: Normal rate and regular rhythm.     Heart sounds:  Normal heart sounds. No murmur. No friction rub. No gallop.   Pulmonary:     Effort: Pulmonary effort is normal. No respiratory distress.     Breath sounds: Normal breath sounds. No stridor. No wheezing, rhonchi or rales.  Chest:     Chest wall: No tenderness.  Abdominal:  General: Abdomen is flat. Bowel sounds are normal. There is no distension.     Palpations: Abdomen is soft. There is no mass.     Tenderness: There is no abdominal tenderness. There is no left CVA tenderness, guarding or rebound.     Hernia: No hernia is present.  Musculoskeletal: Normal range of motion.        General: No swelling, tenderness, deformity or signs of injury.     Right lower leg: No edema.     Left lower leg: No edema.  Lymphadenopathy:     Cervical: No cervical adenopathy.  Skin:    General: Skin is warm and dry.     Capillary Refill: Capillary refill takes less than 2 seconds.     Coloration: Skin is not jaundiced or pale.     Findings: No bruising, erythema, lesion or rash.  Neurological:     General: No focal deficit present.     Mental Status: He is alert and oriented to person, place, and time. Mental status is at baseline.     Cranial Nerves: No cranial nerve deficit.     Sensory: No sensory deficit.     Motor: No weakness or abnormal muscle tone.     Coordination: Coordination normal.     Gait: Gait normal.     Deep Tendon Reflexes: Reflexes are normal and symmetric. Reflexes normal.  Psychiatric:        Mood and Affect: Mood normal.        Behavior: Behavior normal.        Thought Content: Thought content normal.        Judgment: Judgment normal.       Assessment & Plan:  1. Routine general medical examination at a health care facility - Benign exam  - Follow up in one year or sooner if needed - CBC with Differential/Platelet - Comprehensive metabolic panel - Lipid panel - TSH  2. Elevated liver enzymes - Consider [place] of RUQ  - Comprehensive metabolic panel - Lipid panel    Dorothyann Peng, NP

## 2022-06-09 ENCOUNTER — Encounter: Payer: Self-pay | Admitting: Adult Health

## 2022-06-09 NOTE — Telephone Encounter (Signed)
Please advise 

## 2022-12-25 ENCOUNTER — Encounter: Payer: Self-pay | Admitting: Adult Health

## 2023-01-30 ENCOUNTER — Encounter: Payer: Self-pay | Admitting: Family Medicine

## 2023-01-30 ENCOUNTER — Ambulatory Visit: Payer: 59 | Admitting: Family Medicine

## 2023-01-30 VITALS — BP 98/60 | HR 57 | Temp 98.2°F | Ht 67.0 in | Wt 142.5 lb

## 2023-01-30 DIAGNOSIS — Z0000 Encounter for general adult medical examination without abnormal findings: Secondary | ICD-10-CM

## 2023-01-30 DIAGNOSIS — Z13220 Encounter for screening for lipoid disorders: Secondary | ICD-10-CM

## 2023-01-30 NOTE — Progress Notes (Signed)
Complete physical exam  Patient: Jose Odom   DOB: [birth year]   27 y.o. Male  MRN: [id number]  Subjective:    Chief Complaint  Patient presents with   Establish Care    Jose Odom is a 27 y.o. male who presents today for a complete physical exam. He reports consuming a general diet. The patient has a physically strenuous job, but has no regular exercise apart from work.  He generally feels well. He reports sleeping well. He does not have additional problems to discuss today.    Most recent fall risk assessment:    03/05/2019   10:21 AM  Fall Risk   Falls in the past year? 0     Most recent depression screenings:    01/30/2023    2:58 PM 03/05/2019   10:21 AM  PHQ 2/9 Scores  PHQ - 2 Score 0 0    Vision:Within last year and wear glasses and Dental: No current dental problems and Receives regular dental care  There are no problems to display for this patient.     Patient Care Team: Shirline Frees, NP as PCP - General (Family Medicine)   Outpatient Medications Prior to Visit  Medication Sig   ibuprofen (ADVIL) 200 MG tablet Take 200 mg by mouth as needed.   No facility-administered medications prior to visit.    Review of Systems  HENT:  Negative for hearing loss.   Eyes:  Negative for blurred vision.  Respiratory:  Negative for shortness of breath.   Cardiovascular:  Negative for chest pain.  Gastrointestinal: Negative.   Genitourinary: Negative.   Musculoskeletal:  Negative for back pain.  Neurological:  Negative for headaches.  Psychiatric/Behavioral:  Negative for depression.        Objective:     BP 98/60 (BP Location: Left Arm, Patient Position: Sitting, Cuff Size: Normal)   Pulse (!) 57   Temp 98.2 F (36.8 C) (Oral)   Ht 5\' 7"  (1.702 m)   Wt 142 lb 8 oz (64.6 kg)   SpO2 98%   BMI 22.32 kg/m    Physical Exam Vitals reviewed.  Constitutional:      Appearance: Normal appearance. He is well-groomed and normal weight.  HENT:      Right Ear: Tympanic membrane normal.     Left Ear: Tympanic membrane normal.     Mouth/Throat:     Mouth: Mucous membranes are moist.     Pharynx: No posterior oropharyngeal erythema.  Eyes:     Extraocular Movements: Extraocular movements intact.     Conjunctiva/sclera: Conjunctivae normal.  Neck:     Thyroid: No thyromegaly.  Cardiovascular:     Rate and Rhythm: Normal rate and regular rhythm.     Heart sounds: S1 normal and S2 normal. No murmur heard. Pulmonary:     Effort: Pulmonary effort is normal.     Breath sounds: Normal breath sounds and air entry. No rales.  Abdominal:     General: Abdomen is flat. Bowel sounds are normal.  Musculoskeletal:     Right lower leg: No edema.     Left lower leg: No edema.  Lymphadenopathy:     Cervical: No cervical adenopathy.  Neurological:     General: No focal deficit present.     Mental Status: He is alert and oriented to person, place, and time.     Gait: Gait is intact.  Psychiatric:        Mood and Affect: Mood and affect normal.  No results found for any visits on 01/30/23.     Assessment & Plan:    Routine Health Maintenance and Physical Exam  Immunization History  Administered Date(s) Administered   Tdap 11/08/2015    Health Maintenance  Topic Date Due   COVID-19 Vaccine (1) 02/15/2023 (Originally 05/06/1996)   INFLUENZA VACCINE  03/01/2023   DTaP/Tdap/Td (2 - Td or Tdap) 11/07/2025   Hepatitis C Screening  Completed   HIV Screening  Completed   HPV VACCINES  Aged Out    Discussed health benefits of physical activity, and encouraged him to engage in regular exercise appropriate for his age and condition.  Routine adult health maintenance -     Comprehensive metabolic panel; Future -     CBC with Differential/Platelet; Future  Lipid screening -     Lipid panel; Future   Normal physical exam findings today, pt encouraged to add omega 3 fatty acids like EVOO and avocado to his diet to improve HDL,  ordering his annual labs for screening purposes. Handouts given on healthy eating and exercise.   Return in about 1 year (around 01/30/2024) for annual physical exam.     Karie Georges, MD

## 2023-01-31 ENCOUNTER — Other Ambulatory Visit (INDEPENDENT_AMBULATORY_CARE_PROVIDER_SITE_OTHER): Payer: 59

## 2023-01-31 DIAGNOSIS — Z0000 Encounter for general adult medical examination without abnormal findings: Secondary | ICD-10-CM

## 2023-01-31 DIAGNOSIS — Z13220 Encounter for screening for lipoid disorders: Secondary | ICD-10-CM | POA: Diagnosis not present

## 2023-01-31 LAB — CBC WITH DIFFERENTIAL/PLATELET
Basophils Absolute: 0 10*3/uL (ref 0.0–0.1)
Basophils Relative: 0.4 % (ref 0.0–3.0)
Eosinophils Absolute: 0.1 10*3/uL (ref 0.0–0.7)
Eosinophils Relative: 1 % (ref 0.0–5.0)
HCT: 50 % (ref 39.0–52.0)
Hemoglobin: 16.8 g/dL (ref 13.0–17.0)
Lymphocytes Relative: 42.7 % (ref 12.0–46.0)
Lymphs Abs: 2.7 10*3/uL (ref 0.7–4.0)
MCHC: 33.6 g/dL (ref 30.0–36.0)
MCV: 92 fl (ref 78.0–100.0)
Monocytes Absolute: 0.6 10*3/uL (ref 0.1–1.0)
Monocytes Relative: 9 % (ref 3.0–12.0)
Neutro Abs: 2.9 10*3/uL (ref 1.4–7.7)
Neutrophils Relative %: 46.9 % (ref 43.0–77.0)
Platelets: 356 10*3/uL (ref 150.0–400.0)
RBC: 5.43 Mil/uL (ref 4.22–5.81)
RDW: 12.8 % (ref 11.5–15.5)
WBC: 6.2 10*3/uL (ref 4.0–10.5)

## 2023-01-31 LAB — LIPID PANEL
Cholesterol: 187 mg/dL (ref 0–200)
HDL: 66 mg/dL (ref >39.00)
LDL Cholesterol: 101 mg/dL — ABNORMAL HIGH (ref 0–99)
NonHDL: 120.56
Total CHOL/HDL Ratio: 3
Triglycerides: 100 mg/dL (ref 0.0–149.0)
VLDL: 20 mg/dL (ref 0.0–40.0)

## 2023-01-31 LAB — COMPREHENSIVE METABOLIC PANEL
ALT: 28 U/L (ref 0–53)
AST: 27 U/L (ref 0–37)
Albumin: 4.3 g/dL (ref 3.5–5.2)
Alkaline Phosphatase: 62 U/L (ref 39–117)
BUN: 13 mg/dL (ref 6–23)
CO2: 30 mEq/L (ref 19–32)
Calcium: 9.7 mg/dL (ref 8.4–10.5)
Chloride: 102 mEq/L (ref 96–112)
Creatinine, Ser: 0.99 mg/dL (ref 0.40–1.50)
GFR: 103.14 mL/min (ref >60.00)
Glucose, Bld: 87 mg/dL (ref 70–99)
Potassium: 4.1 mEq/L (ref 3.5–5.1)
Sodium: 141 mEq/L (ref 135–145)
Total Bilirubin: 1.2 mg/dL (ref 0.2–1.2)
Total Protein: 6.9 g/dL (ref 6.0–8.3)

## 2023-02-08 ENCOUNTER — Encounter: Payer: Self-pay | Admitting: Family Medicine

## 2023-02-12 ENCOUNTER — Ambulatory Visit: Payer: 59 | Admitting: Family Medicine

## 2023-02-12 ENCOUNTER — Telehealth: Payer: Self-pay | Admitting: Family Medicine

## 2023-02-12 VITALS — BP 100/58 | HR 60 | Temp 98.2°F | Ht 67.0 in | Wt 139.8 lb

## 2023-02-12 DIAGNOSIS — H6121 Impacted cerumen, right ear: Secondary | ICD-10-CM

## 2023-02-12 DIAGNOSIS — Z0279 Encounter for issue of other medical certificate: Secondary | ICD-10-CM

## 2023-02-12 NOTE — Telephone Encounter (Signed)
Patient dropped off document  Information Release form , to be filled out by provider. Patient requested to send it back via Call Patient to pick up within 5-days. Document is located in providers tray at front office.Please advise at Mobile [phone] (mobile)

## 2023-02-12 NOTE — Progress Notes (Signed)
   Established Patient Office Visit  Subjective   Patient ID: Jose Odom, male    DOB: 1995-10-09  Age: 27 y.o. MRN: [id number]  Chief Complaint  Patient presents with   Cerumen Impaction    Patient complains of right ear wax x2 says, initially noticed after swimming    HPI   Here for right ear fullness.  [place] and felt like the fullness in the right ear was greater after he got out of the water.  No ear pain.  No drainage.  No dizziness.  He had cerumen impaction in the past and feels very similar.  No left ear symptoms.  No nasal congestion.  Past Medical History:  Diagnosis Date   Acne    Allergy    Past Surgical History:  Procedure Laterality Date   EYE SURGERY  1999   WISDOM TOOTH EXTRACTION      reports that he has never smoked. He has never used smokeless tobacco. He reports current alcohol use of about 3.0 - 5.0 standard drinks of alcohol per week. He reports that he does not currently use drugs. family history includes Alcohol abuse in his father; Cancer in his paternal grandmother; Diabetes in his maternal grandfather, maternal grandmother, and mother; Hearing loss in his mother; Heart attack in his father, maternal grandfather, and paternal grandfather; Heart disease in his father and maternal grandfather; Hyperlipidemia in his maternal grandfather, maternal grandmother, and mother; Hypertension in his father, maternal grandfather, and paternal grandfather; Melanoma in his mother; Stroke in his maternal grandmother. Allergies  Allergen Reactions   Neosporin [Neomycin-Bacitracin Zn-Polymyx] Rash   Amoxicillin Rash   Penicillins Rash    Review of Systems  Constitutional:  Negative for chills and fever.  HENT:  Negative for congestion, ear discharge and ear pain.       Objective:     BP (!) 100/58 (BP Location: Left Arm, Patient Position: Sitting, Cuff Size: Normal)   Pulse 60   Temp 98.2 F (36.8 C) (Oral)    Ht 5\' 7"  (1.702 m)   Wt 139 lb 12.8 oz (63.4 kg)   SpO2 99%   BMI 21.90 kg/m    Physical Exam Vitals reviewed.  Constitutional:      Appearance: Normal appearance.  HENT:     Ears:     Comments: Left ear canal is clear.  Right is impacted with cerumen. Cardiovascular:     Rate and Rhythm: Normal rate and regular rhythm.  Neurological:     Mental Status: He is alert.      No results found for any visits on 02/12/23.    The ASCVD Risk score (Arnett DK, et al., 2019) failed to calculate for the following reasons:   The 2019 ASCVD risk score is only valid for ages 6 to 3    Assessment & Plan:   Right cerumen impaction.  Discussed risk of irrigation including pain, bleeding, low risk of eardrum perforation.  Patient consented Right ear canal irrigated per nurse with removal of cerumen.  Patient symptomatically improved and tolerated well.  Follow-up as needed   Evelena Peat, MD

## 2023-02-14 NOTE — Telephone Encounter (Signed)
Spoke with the patient and informed him PCP completed the form, charged a $29 fee and these were left at the front desk for pick up.  Copy sent to be scanned.

## 2024-02-08 ENCOUNTER — Encounter: Payer: Self-pay | Admitting: Family Medicine

## 2024-02-08 ENCOUNTER — Ambulatory Visit: Admitting: Family Medicine

## 2024-02-08 VITALS — BP 112/78 | HR 70 | Temp 97.9°F | Ht 67.0 in | Wt 145.3 lb

## 2024-02-08 DIAGNOSIS — R110 Nausea: Secondary | ICD-10-CM | POA: Diagnosis not present

## 2024-02-08 DIAGNOSIS — R6881 Early satiety: Secondary | ICD-10-CM | POA: Diagnosis not present

## 2024-02-08 DIAGNOSIS — R630 Anorexia: Secondary | ICD-10-CM

## 2024-02-08 LAB — CBC WITH DIFFERENTIAL/PLATELET
Basophils Absolute: 0 K/uL (ref 0.0–0.1)
Basophils Relative: 0.2 % (ref 0.0–3.0)
Eosinophils Absolute: 0.1 K/uL (ref 0.0–0.7)
Eosinophils Relative: 1.1 % (ref 0.0–5.0)
HCT: 49 % (ref 39.0–52.0)
Hemoglobin: 16.8 g/dL (ref 13.0–17.0)
Lymphocytes Relative: 29.9 % (ref 12.0–46.0)
Lymphs Abs: 1.9 K/uL (ref 0.7–4.0)
MCHC: 34.4 g/dL (ref 30.0–36.0)
MCV: 92.4 fl (ref 78.0–100.0)
Monocytes Absolute: 1 K/uL (ref 0.1–1.0)
Monocytes Relative: 15 % — ABNORMAL HIGH (ref 3.0–12.0)
Neutro Abs: 3.5 K/uL (ref 1.4–7.7)
Neutrophils Relative %: 53.8 % (ref 43.0–77.0)
Platelets: 486 K/uL — ABNORMAL HIGH (ref 150.0–400.0)
RBC: 5.3 Mil/uL (ref 4.22–5.81)
RDW: 12.8 % (ref 11.5–15.5)
WBC: 6.4 K/uL (ref 4.0–10.5)

## 2024-02-08 LAB — COMPREHENSIVE METABOLIC PANEL WITH GFR
ALT: 16 U/L (ref 0–53)
AST: 19 U/L (ref 0–37)
Albumin: 4.6 g/dL (ref 3.5–5.2)
Alkaline Phosphatase: 58 U/L (ref 39–117)
BUN: 21 mg/dL (ref 6–23)
CO2: 31 meq/L (ref 19–32)
Calcium: 10.1 mg/dL (ref 8.4–10.5)
Chloride: 102 meq/L (ref 96–112)
Creatinine, Ser: 2.36 mg/dL — ABNORMAL HIGH (ref 0.40–1.50)
GFR: 36.11 mL/min — ABNORMAL LOW (ref >60.00)
Glucose, Bld: 90 mg/dL (ref 70–99)
Potassium: 3.9 meq/L (ref 3.5–5.1)
Sodium: 139 meq/L (ref 135–145)
Total Bilirubin: 0.9 mg/dL (ref 0.2–1.2)
Total Protein: 7.3 g/dL (ref 6.0–8.3)

## 2024-02-08 MED ORDER — ONDANSETRON HCL 4 MG PO TABS: 4.0000 mg | ORAL_TABLET | Freq: Three times a day (TID) | ORAL | 0 refills | Status: DC | PRN

## 2024-02-08 NOTE — Progress Notes (Unsigned)
 Established Patient Office Visit  Subjective   Patient ID: Jose Odom, male    DOB: Jul 02, 1996  Age: 28 y.o. MRN: [id number]  Chief Complaint  Patient presents with   Anorexia    Patient complains of a lack of appetite and often feels nauseous after eating intermittently x3-4 weeks   Fatigue    X2-3 months    Pt reports he has had lack of appetite, noted about 3-4 weeks ago, then nausea started about a week ago. States he had changed his diet, was trying to gain muscle, states since he started feeling nauseous he stopped the diet and went back to eating carbs. No other associated symptoms, no fever or chills, no abdominal pain, states that he is having relatively regular BM's, no blood or other stool changes. No acid reflux or heartburn. States that he feels like he has to wait a long time inbetween meals before he feels hungry again, when he starts eating he has to stop.     Current Outpatient Medications  Medication Instructions   ibuprofen (ADVIL) 200 mg, As needed   ondansetron  (ZOFRAN ) 4 mg, Oral, Every 8 hours PRN    There are no active problems to display for this patient.     Review of Systems  All other systems reviewed and are negative.     Objective:     BP 112/78   Pulse 70   Temp 97.9 F (36.6 C) (Oral)   Ht 5' 7 (1.702 m)   Wt 145 lb 4.8 oz (65.9 kg)   SpO2 97%   BMI 22.76 kg/m    Physical Exam Vitals reviewed.  Constitutional:      Appearance: Normal appearance. He is not ill-appearing (not ill appearing but he looks pale, thin).  Neck:     Thyroid : No thyromegaly.  Cardiovascular:     Rate and Rhythm: Normal rate and regular rhythm.     Heart sounds: Normal heart sounds. No murmur heard. Pulmonary:     Effort: Pulmonary effort is normal.     Breath sounds: Normal breath sounds. No wheezing.  Abdominal:     General: Abdomen is flat. Bowel sounds are normal.     Palpations: Abdomen is soft.  Skin:    Coloration: Skin is pale.   Neurological:     Mental Status: He is alert and oriented to person, place, and time. Mental status is at baseline.  Psychiatric:        Mood and Affect: Mood normal.        Behavior: Behavior normal.      No results found for any visits on 02/08/24.    The ASCVD Risk score (Arnett DK, et al., 2019) failed to calculate for the following reasons:   The 2019 ASCVD risk score is only valid for ages 33 to 45    Assessment & Plan:  Early satiety -     Comprehensive metabolic panel with GFR; Future -     CBC with Differential/Platelet; Future -     CT ABDOMEN PELVIS W CONTRAST; Future  Nausea -     Ondansetron  HCl; Take 1 tablet (4 mg total) by mouth every 8 (eight) hours as needed for nausea or vomiting.  Dispense: 20 tablet; Refill: 0 -     CT ABDOMEN PELVIS W CONTRAST; Future  Decreased appetite -     Comprehensive metabolic panel with GFR; Future -     CBC with Differential/Platelet; Future -     CT ABDOMEN  PELVIS W CONTRAST; Future   Multiple GI symptoms that are concerning in an otherwise healthy patient. We discussed possible etiologies and I recommended starting a work up with CBC, CMP and CT abd/pelvis. Patient is going to be likely referred to a specialist depending on what the findings are.   No follow-ups on file.    Heron CHRISTELLA Sharper, MD

## 2024-02-08 NOTE — Patient Instructions (Signed)
 Pepcid 20 mg twice a day (over the counter)

## 2024-02-13 ENCOUNTER — Encounter: Payer: Self-pay | Admitting: Family Medicine

## 2024-02-13 DIAGNOSIS — N179 Acute kidney failure, unspecified: Secondary | ICD-10-CM

## 2024-02-15 ENCOUNTER — Ambulatory Visit
Admission: RE | Admit: 2024-02-15 | Discharge: 2024-02-15 | Disposition: A | Discharge status: Home / Self Care | Source: Ambulatory Visit | Attending: Family Medicine | Admitting: Family Medicine

## 2024-02-15 DIAGNOSIS — R630 Anorexia: Secondary | ICD-10-CM

## 2024-02-15 DIAGNOSIS — R6881 Early satiety: Secondary | ICD-10-CM

## 2024-02-15 DIAGNOSIS — R110 Nausea: Secondary | ICD-10-CM

## 2024-02-25 ENCOUNTER — Ambulatory Visit: Payer: Self-pay | Admitting: Family Medicine

## 2024-02-29 ENCOUNTER — Telehealth: Payer: Self-pay | Admitting: *Deleted

## 2024-02-29 NOTE — Telephone Encounter (Signed)
-----   Message from Heron CHRISTELLA Sharper sent at 02/29/2024  1:06 PM EDT ----- Regarding: FW: referral Please order a BMP for this patient for AKI and have him come in for blood work. Thanks! Dr CHRISTELLA ----- Message ----- From: Macel Jayson PARAS, MD Sent: 02/22/2024   8:55 AM EDT To: Heron CHRISTELLA Sharper, MD Subject: referral                                       Hi Dr. Sharper,  I am reviewing this patient's chart for referral to nephrology.  Saw he had a pretty severe AKI a couple weeks ago associated with nausea.  Could you please repeat a creatinine as soon as possible.  We can try and get the patient in soon but I want to make sure we keep an eye on the creatinine trend in the meantime (if worsening may need to go to the hospital).  Sometimes if these AKIs are simply related to hypovolemia the creatinine will return to normal quickly as the patient feels better.  If the patient's creatinine does come back down to normal you can let us  know if we can cancel the referral.  Thanks for the help.  Best,  [facility]

## 2024-02-29 NOTE — Telephone Encounter (Signed)
 Left a message for the patient to return my call.

## 2024-03-18 ENCOUNTER — Encounter: Payer: Self-pay | Admitting: Family Medicine

## 2024-03-18 ENCOUNTER — Ambulatory Visit (INDEPENDENT_AMBULATORY_CARE_PROVIDER_SITE_OTHER): Admitting: Family Medicine

## 2024-03-18 VITALS — BP 100/50 | HR 48 | Temp 97.8°F | Ht 68.0 in | Wt 139.8 lb

## 2024-03-18 DIAGNOSIS — Z13220 Encounter for screening for lipoid disorders: Secondary | ICD-10-CM

## 2024-03-18 DIAGNOSIS — N179 Acute kidney failure, unspecified: Secondary | ICD-10-CM

## 2024-03-18 DIAGNOSIS — Z0000 Encounter for general adult medical examination without abnormal findings: Secondary | ICD-10-CM

## 2024-03-18 LAB — BASIC METABOLIC PANEL WITH GFR
BUN: 13 mg/dL (ref 6–23)
CO2: 31 meq/L (ref 19–32)
Calcium: 9.9 mg/dL (ref 8.4–10.5)
Chloride: 100 meq/L (ref 96–112)
Creatinine, Ser: 1.03 mg/dL (ref 0.40–1.50)
GFR: 97.57 mL/min (ref >60.00)
Glucose, Bld: 80 mg/dL (ref 70–99)
Potassium: 4.2 meq/L (ref 3.5–5.1)
Sodium: 144 meq/L (ref 135–145)

## 2024-03-18 LAB — LIPID PANEL
Cholesterol: 184 mg/dL (ref 0–200)
HDL: 53.2 mg/dL (ref >39.00)
LDL Cholesterol: 105 mg/dL — ABNORMAL HIGH (ref 0–99)
NonHDL: 131.24
Total CHOL/HDL Ratio: 3
Triglycerides: 133 mg/dL (ref 0.0–149.0)
VLDL: 26.6 mg/dL (ref 0.0–40.0)

## 2024-03-18 NOTE — Patient Instructions (Signed)
 Health Maintenance, Male  Adopting a healthy lifestyle and getting preventive care are important in promoting health and wellness. Ask your health care provider about:  The right schedule for you to have regular tests and exams.  Things you can do on your own to prevent diseases and keep yourself healthy.  What should I know about diet, weight, and exercise?  Eat a healthy diet    Eat a diet that includes plenty of vegetables, fruits, low-fat dairy products, and lean protein.  Do not eat a lot of foods that are high in solid fats, added sugars, or sodium.  Maintain a healthy weight  Body mass index (BMI) is a measurement that can be used to identify possible weight problems. It estimates body fat based on height and weight. Your health care provider can help determine your BMI and help you achieve or maintain a healthy weight.  Get regular exercise  Get regular exercise. This is one of the most important things you can do for your health. Most adults should:  Exercise for at least 150 minutes each week. The exercise should increase your heart rate and make you sweat (moderate-intensity exercise).  Do strengthening exercises at least twice a week. This is in addition to the moderate-intensity exercise.  Spend less time sitting. Even light physical activity can be beneficial.  Watch cholesterol and blood lipids  Have your blood tested for lipids and cholesterol at 30 years of age, then have this test every 5 years.  You may need to have your cholesterol levels checked more often if:  Your lipid or cholesterol levels are high.  You are older than 30 years of age.  You are at high risk for heart disease.  What should I know about cancer screening?  Many types of cancers can be detected early and may often be prevented. Depending on your health history and family history, you may need to have cancer screening at various ages. This may include screening for:  Colorectal cancer.  Prostate cancer.  Skin cancer.  Lung  cancer.  What should I know about heart disease, diabetes, and high blood pressure?  Blood pressure and heart disease  High blood pressure causes heart disease and increases the risk of stroke. This is more likely to develop in people who have high blood pressure readings or are overweight.  Talk with your health care provider about your target blood pressure readings.  Have your blood pressure checked:  Every 3-5 years if you are 24-52 years of age.  Every year if you are 3 years old or older.  If you are between the ages of 60 and 72 and are a current or former smoker, ask your health care provider if you should have a one-time screening for abdominal aortic aneurysm (AAA).  Diabetes  Have regular diabetes screenings. This checks your fasting blood sugar level. Have the screening done:  Once every three years after age 66 if you are at a normal weight and have a low risk for diabetes.  More often and at a younger age if you are overweight or have a high risk for diabetes.  What should I know about preventing infection?  Hepatitis B  If you have a higher risk for hepatitis B, you should be screened for this virus. Talk with your health care provider to find out if you are at risk for hepatitis B infection.  Hepatitis C  Blood testing is recommended for:  Everyone born from [birth year] through 1967.  Anyone  with known risk factors for hepatitis C.  Sexually transmitted infections (STIs)  You should be screened each year for STIs, including gonorrhea and chlamydia, if:  You are sexually active and are younger than 30 years of age.  You are older than 30 years of age and your health care provider tells you that you are at risk for this type of infection.  Your sexual activity has changed since you were last screened, and you are at increased risk for chlamydia or gonorrhea. Ask your health care provider if you are at risk.  Ask your health care provider about whether you are at high risk for HIV. Your health care provider  may recommend a prescription medicine to help prevent HIV infection. If you choose to take medicine to prevent HIV, you should first get tested for HIV. You should then be tested every 3 months for as long as you are taking the medicine.  Follow these instructions at home:  Alcohol use  Do not drink alcohol if your health care provider tells you not to drink.  If you drink alcohol:  Limit how much you have to 0-2 drinks a day.  Know how much alcohol is in your drink. In the [place] one drink equals one 12 oz bottle of beer (355 mL), one 5 oz glass of wine (148 mL), or one 1 oz glass of hard liquor (44 mL).  Lifestyle  Do not use any products that contain nicotine or tobacco. These products include cigarettes, chewing tobacco, and vaping devices, such as e-cigarettes. If you need help quitting, ask your health care provider.  Do not use street drugs.  Do not share needles.  Ask your health care provider for help if you need support or information about quitting drugs.  General instructions  Schedule regular health, dental, and eye exams.  Stay current with your vaccines.  Tell your health care provider if:  You often feel depressed.  You have ever been abused or do not feel safe at home.  Summary  Adopting a healthy lifestyle and getting preventive care are important in promoting health and wellness.  Follow your health care provider's instructions about healthy diet, exercising, and getting tested or screened for diseases.  Follow your health care provider's instructions on monitoring your cholesterol and blood pressure.  This information is not intended to replace advice given to you by your health care provider. Make sure you discuss any questions you have with your health care provider.  Document Revised: 12/06/2020 Document Reviewed: 12/06/2020  Elsevier Patient Education  2024 [employer].

## 2024-03-18 NOTE — Progress Notes (Signed)
 Complete physical exam  Patient: Jose Odom   DOB: 09-22-1995   28 y.o. Male  MRN: [id number]  Subjective:    Chief Complaint  Patient presents with   Annual Exam    Jose Odom is a 28 y.o. male who presents today for a complete physical exam. He reports consuming a general diet eats good sources of protein, will eat fruits and veggies, does eat chips daily, will eat fast food. The patient has a physically strenuous job, but has no regular exercise apart from work.  He generally feels well. He reports sleeping well, feels well rested in the morning.SABRA He does not have additional problems to discuss today.   Does not take any vitamins or supplements.   Most recent fall risk assessment:    03/05/2019   10:21 AM  Fall Risk   Falls in the past year? 0      Data saved with a previous flowsheet row definition     Most recent depression screenings:    02/08/2024    1:55 PM 01/30/2023    2:58 PM  PHQ 2/9 Scores  PHQ - 2 Score 0 0  PHQ- 9 Score 4     Vision:Not within last year  and sees Dr. Cleatus, no vision issues and Dental: No current dental problems and Receives regular dental care  There are no active problems to display for this patient.     Patient Care Team: Ozell Heron HERO, MD as PCP - General (Family Medicine)   Outpatient Medications Prior to Visit  Medication Sig   ibuprofen (ADVIL) 200 MG tablet Take 200 mg by mouth as needed.   [DISCONTINUED] ondansetron  (ZOFRAN ) 4 MG tablet Take 1 tablet (4 mg total) by mouth every 8 (eight) hours as needed for nausea or vomiting.   No facility-administered medications prior to visit.    Review of Systems  HENT:  Negative for hearing loss.   Eyes:  Negative for blurred vision.  Respiratory:  Negative for shortness of breath.   Cardiovascular:  Negative for chest pain.  Gastrointestinal: Negative.   Genitourinary: Negative.   Musculoskeletal:  Negative for back pain.  Neurological:  Negative for headaches.   Psychiatric/Behavioral:  Negative for depression.        Objective:     BP (!) 100/50   Pulse (!) 48   Temp 97.8 F (36.6 C) (Oral)   Ht 5' 8 (1.727 m)   Wt 139 lb 12.8 oz (63.4 kg)   SpO2 99%   BMI 21.26 kg/m    Physical Exam Vitals reviewed.  Constitutional:      Appearance: Normal appearance. He is well-groomed and normal weight.  HENT:     Right Ear: Tympanic membrane and ear canal normal.     Left Ear: Tympanic membrane and ear canal normal.     Mouth/Throat:     Mouth: Mucous membranes are moist.     Pharynx: No posterior oropharyngeal erythema.  Eyes:     Extraocular Movements: Extraocular movements intact.     Conjunctiva/sclera: Conjunctivae normal.  Neck:     Thyroid : No thyromegaly.  Cardiovascular:     Rate and Rhythm: Normal rate and regular rhythm.     Heart sounds: S1 normal and S2 normal. No murmur heard. Pulmonary:     Effort: Pulmonary effort is normal.     Breath sounds: Normal breath sounds and air entry. No rales.  Abdominal:     General: Abdomen is flat. Bowel sounds are  normal.  Musculoskeletal:     Right lower leg: No edema.     Left lower leg: No edema.  Lymphadenopathy:     Cervical: No cervical adenopathy.  Neurological:     General: No focal deficit present.     Mental Status: He is alert and oriented to person, place, and time.     Gait: Gait is intact.  Psychiatric:        Mood and Affect: Mood and affect normal.      No results found for any visits on 03/18/24.     Assessment & Plan:    Routine Health Maintenance and Physical Exam  Immunization History  Administered Date(s) Administered   Tdap 11/08/2015    Health Maintenance  Topic Date Due   Hepatitis B Vaccines 19-59 Average Risk (1 of 3 - 19+ 3-dose series) Never done   HPV VACCINES (1 - 3-dose SCDM series) Never done   COVID-19 Vaccine (1 - 2024-25 season) Never done   INFLUENZA VACCINE  02/29/2024   DTaP/Tdap/Td (2 - Td or Tdap) 11/07/2025   Hepatitis C  Screening  Completed   HIV Screening  Completed   Pneumococcal Vaccine  Aged Out   Meningococcal B Vaccine  Aged Out    Discussed health benefits of physical activity, and encouraged him to engage in regular exercise appropriate for his age and condition.  Acute kidney injury (HCC) -     Basic metabolic panel with GFR; Future  Lipid screening -     Lipid panel; Future  Routine general medical examination at a health care facility  Normal physical exam findings. I counseled the patient on the recommended amount of exercise per CDC recommendation. I reviewed preventative screening, immunizations, and medical history and updated in the chart, and appropriate labs and vaccinations were ordered. Handouts given on healthy eating and exercise.    Return in 1 year (on 03/18/2025).     Heron CHRISTELLA Sharper, MD

## 2024-03-19 ENCOUNTER — Ambulatory Visit: Payer: Self-pay | Admitting: Family Medicine
